# Patient Record
Sex: Male | Born: 1990 | Race: Black or African American | Hispanic: No | Marital: Single | Smoking: Never smoker
Health system: Southern US, Community
[De-identification: ages and names within clinical notes are randomized; demographics above are authoritative.]

## PROBLEM LIST (undated history)

## (undated) ENCOUNTER — Emergency Department
Admission: EM | Discharge status: Absent without leave | Payer: No Typology Code available for payment source | Source: Home / Self Care

---
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
## 2010-10-04 LAB — POCT RAPID STREP A: Streptococcus, Group A Screen (Direct): NEGATIVE

## 2010-10-04 LAB — STREP A DNA PROBE: Group A Strep Probe: NEGATIVE

## 2016-11-17 ENCOUNTER — Encounter: Payer: Self-pay | Admitting: Emergency Medicine

## 2016-11-17 ENCOUNTER — Emergency Department
Admission: EM | Admit: 2016-11-17 | Discharge: 2016-11-18 | Disposition: A | Discharge status: Home / Self Care | Payer: No Typology Code available for payment source | Attending: Emergency Medicine | Admitting: Emergency Medicine

## 2016-11-17 ENCOUNTER — Emergency Department: Payer: No Typology Code available for payment source

## 2016-11-17 ENCOUNTER — Other Ambulatory Visit: Payer: Self-pay

## 2016-11-17 DIAGNOSIS — Y92410 Unspecified street and highway as the place of occurrence of the external cause: Secondary | ICD-10-CM | POA: Insufficient documentation

## 2016-11-17 DIAGNOSIS — Y999 Unspecified external cause status: Secondary | ICD-10-CM | POA: Diagnosis not present

## 2016-11-17 DIAGNOSIS — S0081XA Abrasion of other part of head, initial encounter: Secondary | ICD-10-CM | POA: Diagnosis not present

## 2016-11-17 DIAGNOSIS — Y9389 Activity, other specified: Secondary | ICD-10-CM | POA: Diagnosis not present

## 2016-11-17 DIAGNOSIS — S62314A Displaced fracture of base of fourth metacarpal bone, right hand, initial encounter for closed fracture: Secondary | ICD-10-CM

## 2016-11-17 DIAGNOSIS — S62312A Displaced fracture of base of third metacarpal bone, right hand, initial encounter for closed fracture: Secondary | ICD-10-CM | POA: Insufficient documentation

## 2016-11-17 DIAGNOSIS — S065X9A Traumatic subdural hemorrhage with loss of consciousness of unspecified duration, initial encounter: Secondary | ICD-10-CM | POA: Insufficient documentation

## 2016-11-17 DIAGNOSIS — S065XAA Traumatic subdural hemorrhage with loss of consciousness status unknown, initial encounter: Secondary | ICD-10-CM

## 2016-11-17 DIAGNOSIS — M542 Cervicalgia: Secondary | ICD-10-CM | POA: Insufficient documentation

## 2016-11-17 DIAGNOSIS — S0083XA Contusion of other part of head, initial encounter: Secondary | ICD-10-CM | POA: Diagnosis not present

## 2016-11-17 DIAGNOSIS — T07XXXA Unspecified multiple injuries, initial encounter: Secondary | ICD-10-CM

## 2016-11-17 DIAGNOSIS — Z5329 Procedure and treatment not carried out because of patient's decision for other reasons: Secondary | ICD-10-CM | POA: Insufficient documentation

## 2016-11-17 DIAGNOSIS — S6991XA Unspecified injury of right wrist, hand and finger(s), initial encounter: Secondary | ICD-10-CM | POA: Diagnosis present

## 2016-11-17 LAB — COMPREHENSIVE METABOLIC PANEL
ALT: 27 U/L (ref 17–63)
AST: 45 U/L — AB (ref 15–41)
Albumin: 3.9 g/dL (ref 3.5–5.0)
Alkaline Phosphatase: 51 U/L (ref 38–126)
Anion gap: 10 (ref 5–15)
BUN: 17 mg/dL (ref 6–20)
CHLORIDE: 105 mmol/L (ref 101–111)
CO2: 24 mmol/L (ref 22–32)
CREATININE: 0.98 mg/dL (ref 0.61–1.24)
Calcium: 9 mg/dL (ref 8.9–10.3)
GFR calc Af Amer: >60 mL/min (ref >60)
GLUCOSE: 121 mg/dL — AB (ref 65–99)
Potassium: 3.7 mmol/L (ref 3.5–5.1)
Sodium: 139 mmol/L (ref 135–145)
Total Bilirubin: 0.5 mg/dL (ref 0.3–1.2)
Total Protein: 7.7 g/dL (ref 6.5–8.1)

## 2016-11-17 LAB — CBC WITH DIFFERENTIAL/PLATELET
BASOS ABS: 0 10*3/uL (ref 0–0.1)
Basophils Relative: 1 %
Eosinophils Absolute: 0 10*3/uL (ref 0–0.7)
Eosinophils Relative: 0 %
HEMATOCRIT: 46.9 % (ref 40.0–52.0)
Hemoglobin: 15.8 g/dL (ref 13.0–18.0)
LYMPHS PCT: 11 %
Lymphs Abs: 0.6 10*3/uL — ABNORMAL LOW (ref 1.0–3.6)
MCH: 30.8 pg (ref 26.0–34.0)
MCHC: 33.7 g/dL (ref 32.0–36.0)
MCV: 91.5 fL (ref 80.0–100.0)
Monocytes Absolute: 0.7 10*3/uL (ref 0.2–1.0)
Monocytes Relative: 11 %
NEUTROS ABS: 4.8 10*3/uL (ref 1.4–6.5)
Neutrophils Relative %: 77 %
PLATELETS: 232 10*3/uL (ref 150–440)
RBC: 5.13 MIL/uL (ref 4.40–5.90)
RDW: 13.4 % (ref 11.5–14.5)
WBC: 6.2 10*3/uL (ref 3.8–10.6)

## 2016-11-17 LAB — PROTIME-INR
INR: 0.93
PROTHROMBIN TIME: 12.4 s (ref 11.4–15.2)

## 2016-11-17 LAB — APTT: aPTT: 38 seconds — ABNORMAL HIGH (ref 24–36)

## 2016-11-17 MED ORDER — SODIUM CHLORIDE 0.9 % IV SOLN
1000.0000 mg | Freq: Once | INTRAVENOUS | Status: DC
  Filled 2016-11-17: qty 10
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
## 2016-11-18 MED ORDER — ACETAMINOPHEN 500 MG PO TABS
500.0000 mg | ORAL_TABLET | Freq: Once | ORAL | Status: AC
  Administered 2016-11-18: 500 mg via ORAL
  Filled 2016-11-18: qty 1
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
